# Patient Record
Sex: Male | Born: 1985 | Hispanic: Yes | Marital: Married | Smoking: Current every day smoker
Health system: Southern US, Community
[De-identification: ages and names within clinical notes are randomized; demographics above are authoritative.]

## PROBLEM LIST (undated history)

## (undated) DIAGNOSIS — N471 Phimosis: Secondary | ICD-10-CM

## (undated) DIAGNOSIS — Z973 Presence of spectacles and contact lenses: Secondary | ICD-10-CM

---

## 2003-01-06 ENCOUNTER — Emergency Department (HOSPITAL_COMMUNITY): Admission: EM | Admit: 2003-01-06 | Discharge: 2003-01-06 | Payer: Self-pay | Admitting: Emergency Medicine

## 2003-03-07 ENCOUNTER — Emergency Department (HOSPITAL_COMMUNITY): Admission: EM | Admit: 2003-03-07 | Discharge: 2003-03-07 | Payer: Self-pay | Admitting: Emergency Medicine

## 2003-03-07 ENCOUNTER — Encounter: Payer: Self-pay | Admitting: Emergency Medicine

## 2006-06-17 ENCOUNTER — Emergency Department (HOSPITAL_COMMUNITY): Admission: EM | Admit: 2006-06-17 | Discharge: 2006-06-17 | Payer: Self-pay | Admitting: Emergency Medicine

## 2006-12-02 ENCOUNTER — Emergency Department (HOSPITAL_COMMUNITY): Admission: EM | Admit: 2006-12-02 | Discharge: 2006-12-02 | Payer: Self-pay | Admitting: Emergency Medicine
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
## 2011-09-29 HISTORY — PX: WISDOM TOOTH EXTRACTION: SHX21

## 2015-03-16 ENCOUNTER — Emergency Department (INDEPENDENT_AMBULATORY_CARE_PROVIDER_SITE_OTHER): Payer: 59

## 2015-03-16 ENCOUNTER — Encounter (HOSPITAL_COMMUNITY): Payer: Self-pay | Admitting: Emergency Medicine

## 2015-03-16 ENCOUNTER — Emergency Department (HOSPITAL_COMMUNITY): Payer: 59

## 2015-03-16 ENCOUNTER — Emergency Department (HOSPITAL_COMMUNITY)
Admission: EM | Admit: 2015-03-16 | Discharge: 2015-03-16 | Disposition: A | Discharge status: Home / Self Care | Payer: 59 | Source: Home / Self Care | Attending: Emergency Medicine | Admitting: Emergency Medicine

## 2015-03-16 DIAGNOSIS — S93601A Unspecified sprain of right foot, initial encounter: Secondary | ICD-10-CM | POA: Diagnosis not present

## 2015-03-16 MED ORDER — IBUPROFEN 600 MG PO TABS: 600.0000 mg | ORAL_TABLET | Freq: Four times a day (QID) | ORAL | Status: DC

## 2015-03-16 NOTE — Discharge Instructions (Signed)
You have bruised and sprained your foot. No weightbearing for 3 days. Keep your foot elevated. Apply ice as often as you can. Take ibuprofen 600 mg 3-4 times a day for the next 3 days, then as needed for pain or swelling. No soccer until you can run on the foot without pain. Follow-up as needed.

## 2015-03-16 NOTE — ED Provider Notes (Signed)
CSN: [id number]     Arrival date & time 03/16/15  1418 History   First MD Initiated Contact with Patient 03/16/15 1534     Chief Complaint  Patient presents with  . Foot Injury   (Consider location/radiation/quality/duration/timing/severity/associated sxs/prior Treatment) HPI  He is a 29 year old man here for evaluation of right foot pain. He states he was playing soccer yesterday when he went to kick the ball and instead kicked the bottom of another players shoe. He had immediate pain and swelling. He is able to bear weight, but with significant pain. He has applied icy hot as well as ice packs with minimal improvement. He is able to move his toes and ankle, although this does cause some discomfort.  He works in the [employer].  History reviewed. No pertinent past medical history. History reviewed. No pertinent past surgical history. No family history on file. History  Substance Use Topics  . Smoking status: Never Smoker   . Smokeless tobacco: Not on file  . Alcohol Use: Yes    Review of Systems As in history of present illness Allergies  Review of patient's allergies indicates no known allergies.  Home Medications   Prior to Admission medications   Medication Sig Start Date End Date Taking? Authorizing Provider  ibuprofen (ADVIL,MOTRIN) 600 MG tablet Take 1 tablet (600 mg total) by mouth 4 (four) times daily. For 3 days, then as needed 03/16/15   Charm Rings, MD   BP 115/67 mmHg  Pulse 58  Temp(Src) 98.1 F (36.7 C) (Oral)  Resp 12  SpO2 98% Physical Exam  Constitutional: He is oriented to person, place, and time. He appears well-developed and well-nourished. No distress.  Cardiovascular: Normal rate.   Pulmonary/Chest: Effort normal.  Musculoskeletal:  Right foot: He has significant swelling on the dorsal foot. He has full active and passive range of motion in his toes and ankle. No joint instability. He has a 2+ DP pulse. 5 out of 5 strength. Sensation  grossly intact.  Neurological: He is alert and oriented to person, place, and time.    ED Course  Procedures (including critical care time) Labs Review Labs Reviewed - No data to display  Imaging Review Dg Foot Complete Right  03/16/2015   CLINICAL DATA:  Soccer injury with right foot pain over the dorsum at the region of the metatarsals  EXAM: RIGHT FOOT COMPLETE - 3+ VIEW  COMPARISON:  None.  FINDINGS: There is no evidence of fracture or dislocation. There is no evidence of arthropathy or other focal bone abnormality. Soft tissues are unremarkable. Mild hallux valgus deformity noted.  IMPRESSION: Negative.   Electronically Signed   By: Christiana Pellant M.D.   On: 03/16/2015 15:47     MDM   1. Right foot sprain, initial encounter    X-ray negative for fracture. Conservative management with rest, elevation, ice, ibuprofen. No weightbearing for the next 3 days. Crutches given. Work note provided. Follow-up as needed.    Charm Rings, MD 03/16/15 [phone]

## 2015-03-16 NOTE — ED Notes (Signed)
Reports inj to right foot onset yest while playing soccer States he was trying to kick soccer ball but instead kicked bottom of opponents foot sx include swelling, redness and unable to bear wt Alert, no signs of acute distress.

## 2015-11-27 ENCOUNTER — Other Ambulatory Visit: Payer: Self-pay | Admitting: Urology

## 2015-11-27 DIAGNOSIS — R1030 Lower abdominal pain, unspecified: Secondary | ICD-10-CM | POA: Diagnosis not present

## 2015-11-27 DIAGNOSIS — N471 Phimosis: Secondary | ICD-10-CM | POA: Diagnosis not present

## 2015-12-06 ENCOUNTER — Encounter (HOSPITAL_BASED_OUTPATIENT_CLINIC_OR_DEPARTMENT_OTHER): Payer: Self-pay | Admitting: *Deleted

## 2015-12-06 NOTE — Progress Notes (Addendum)
NPO AFTER MN , PT VERBALIZED UNDERSTANDING THIS INCLUDED NO CHEW/DIP TOBACCO.  ARRIVE AT 0945.  NEEDS HG.

## 2015-12-09 NOTE — H&P (Signed)
  Chief Complaint My skin is tight.   Active Problems Problems  1. Groin pain, right (R10.30) 2. Phimosis (N47.1)  History of Present Illness Derrick Hobbs is a 30 yo male who presents with a long history of penile skin issues. He was seen last year in [place] but a circumcision wasn't done because of insurance issues. He is able to retract the skin but it is tight and he has some cracking of the skin. He has no voiding difficulty. He has normal erections.   Past Medical History Problems  1. History of backache (Z87.39)  Surgical History Problems  1. History of No Surgical Problems  Current Meds 1. Cyclobenzaprine HCl - 10 MG Oral Tablet;  Therapy: (Recorded:01Mar2017) to Recorded  Allergies Medication  1. No Known Drug Allergies  Family History Problems  1. Family history of stroke (Z82.3) : Father  Social History Problems    Alcohol use (Z78.9)   Caffeine use (F15.90)   Current every day smoker (F17.200)   Married   Number of children   Occupation  Review of Systems Genitourinary, constitutional, skin, eye, otolaryngeal, hematologic/lymphatic, cardiovascular, pulmonary, endocrine, musculoskeletal, gastrointestinal, neurological and psychiatric system(s) were reviewed and pertinent findings if present are noted and are otherwise negative.  Musculoskeletal: back pain.    Vitals Vital Signs [Data Includes: Last 1 Day]  Recorded: 01Mar2017 08:54AM  Blood Pressure: 107 / 71 Temperature: 97.6 F Heart Rate: 51 Recorded: 01Mar2017 08:47AM  Height: 5 ft 9 in Weight: 187 lb  BMI Calculated: 27.62 BSA Calculated: 2.01  Physical Exam Constitutional: Well nourished and well developed . No acute distress.  ENT:. The ears and nose are normal in appearance.  Neck: The appearance of the neck is normal and no neck mass is present.  Pulmonary: No respiratory distress and normal respiratory rhythm and effort.  Cardiovascular: Heart rate and rhythm are normal . No peripheral  edema.  Abdomen: The abdomen is soft and nontender. No masses are palpated. No CVA tenderness. No hernias are palpable. but he has tenderness in the right inguinal ring and could have a sports hernia. No hepatosplenomegaly noted.  Genitourinary: Examination of the penis demonstrates phimosis, but no discharge, no masses, no lesions and a normal meatus. The penis is uncircumcised. The scrotum is without lesions. The right epididymis is palpably normal and non-tender. The left epididymis is palpably normal and non-tender. The right testis is non-tender and without masses. The left testis is non-tender and without masses.  Lymphatics: The femoral and inguinal nodes are not enlarged or tender.  Skin: Normal skin turgor, no visible rash and no visible skin lesions.  Neuro/Psych:. Mood and affect are appropriate. Decreased sensation of the perineum/perianal region (S3,4,5).    Assessment Assessed  1. Phimosis (N47.1) 2. Groin pain, right (R10.30)  He has moderate phimosis that is causing problems with erections.   He has right groin pain and a possible sports hernia.   Plan Groin pain, right  1. General Surgery Referral Referral  Referral  Status: Hold For - Appointment,Records   Requested for: 01Mar2017 Phimosis  2. Follow-up Schedule Surgery Office  Follow-up  Status: Hold For - Appointment   Requested for: 01Mar2017  I am going to get him set up for a circumcision and reviewed the risks of bleeding, infection, penile injury, scarring, penile pain, scrotal skin tethering, thrombotic events and anesthetic risks.   I will refer him to Dr. Abbey Chattersosenbower for the groin pain.   Discussion/Summary CC: Dr. Dennis BastYuri Cabeza.

## 2015-12-10 ENCOUNTER — Ambulatory Visit (HOSPITAL_BASED_OUTPATIENT_CLINIC_OR_DEPARTMENT_OTHER): Payer: 59 | Admitting: Anesthesiology

## 2015-12-10 ENCOUNTER — Ambulatory Visit (HOSPITAL_BASED_OUTPATIENT_CLINIC_OR_DEPARTMENT_OTHER)
Admission: RE | Admit: 2015-12-10 | Discharge: 2015-12-10 | Disposition: A | Discharge status: Home / Self Care | Payer: 59 | Source: Ambulatory Visit | Attending: Urology | Admitting: Urology

## 2015-12-10 ENCOUNTER — Encounter (HOSPITAL_BASED_OUTPATIENT_CLINIC_OR_DEPARTMENT_OTHER): Payer: Self-pay | Admitting: *Deleted

## 2015-12-10 ENCOUNTER — Encounter (HOSPITAL_BASED_OUTPATIENT_CLINIC_OR_DEPARTMENT_OTHER)
Admission: RE | Disposition: A | Discharge status: Home / Self Care | Payer: Self-pay | Source: Ambulatory Visit | Attending: Urology

## 2015-12-10 DIAGNOSIS — F17200 Nicotine dependence, unspecified, uncomplicated: Secondary | ICD-10-CM | POA: Insufficient documentation

## 2015-12-10 DIAGNOSIS — R1031 Right lower quadrant pain: Secondary | ICD-10-CM | POA: Insufficient documentation

## 2015-12-10 DIAGNOSIS — N471 Phimosis: Secondary | ICD-10-CM | POA: Insufficient documentation

## 2015-12-10 HISTORY — PX: CIRCUMCISION: SHX1350

## 2015-12-10 HISTORY — DX: Phimosis: N47.1

## 2015-12-10 HISTORY — DX: Presence of spectacles and contact lenses: Z97.3

## 2015-12-10 LAB — POCT HEMOGLOBIN-HEMACUE: Hemoglobin: 12.5 g/dL — ABNORMAL LOW (ref 13.0–17.0)

## 2015-12-10 SURGERY — CIRCUMCISION, ADULT
Anesthesia: General

## 2015-12-10 MED ORDER — ONDANSETRON HCL 4 MG/2ML IJ SOLN
INTRAMUSCULAR | Status: DC | PRN
  Administered 2015-12-10: 4 mg via INTRAVENOUS

## 2015-12-10 MED ORDER — PROPOFOL 10 MG/ML IV BOLUS
INTRAVENOUS | Status: DC | PRN
  Administered 2015-12-10: 200 mg via INTRAVENOUS
  Administered 2015-12-10 (×2): 20 mg via INTRAVENOUS

## 2015-12-10 MED ORDER — MIDAZOLAM HCL 2 MG/2ML IJ SOLN
INTRAMUSCULAR | Status: AC
  Filled 2015-12-10: qty 2

## 2015-12-10 MED ORDER — MIDAZOLAM HCL 2 MG/2ML IJ SOLN
0.5000 mg | Freq: Once | INTRAMUSCULAR | Status: DC | PRN
  Filled 2015-12-10: qty 2

## 2015-12-10 MED ORDER — LIDOCAINE HCL (PF) 1 % IJ SOLN
INTRAMUSCULAR | Status: DC | PRN
  Administered 2015-12-10: 3 mL

## 2015-12-10 MED ORDER — HYDROCODONE-ACETAMINOPHEN 5-325 MG PO TABS: 1.0000 | ORAL_TABLET | Freq: Four times a day (QID) | ORAL | Status: DC | PRN

## 2015-12-10 MED ORDER — PROMETHAZINE HCL 25 MG/ML IJ SOLN
6.2500 mg | INTRAMUSCULAR | Status: DC | PRN
  Filled 2015-12-10: qty 1

## 2015-12-10 MED ORDER — FENTANYL CITRATE (PF) 100 MCG/2ML IJ SOLN
INTRAMUSCULAR | Status: DC | PRN
  Administered 2015-12-10: 50 ug via INTRAVENOUS

## 2015-12-10 MED ORDER — EPHEDRINE SULFATE 50 MG/ML IJ SOLN
INTRAMUSCULAR | Status: AC
  Filled 2015-12-10: qty 1

## 2015-12-10 MED ORDER — EPHEDRINE SULFATE 50 MG/ML IJ SOLN
INTRAMUSCULAR | Status: AC
Start: 2015-12-10 — End: 2015-12-10
  Filled 2015-12-10: qty 1

## 2015-12-10 MED ORDER — ONDANSETRON HCL 4 MG/2ML IJ SOLN
INTRAMUSCULAR | Status: AC
  Filled 2015-12-10: qty 2

## 2015-12-10 MED ORDER — FENTANYL CITRATE (PF) 100 MCG/2ML IJ SOLN
25.0000 ug | INTRAMUSCULAR | Status: DC | PRN
  Filled 2015-12-10: qty 1

## 2015-12-10 MED ORDER — LIDOCAINE HCL (CARDIAC) 20 MG/ML IV SOLN
INTRAVENOUS | Status: AC
  Filled 2015-12-10: qty 5

## 2015-12-10 MED ORDER — BUPIVACAINE HCL (PF) 0.25 % IJ SOLN
INTRAMUSCULAR | Status: DC | PRN
  Administered 2015-12-10: 3 mL

## 2015-12-10 MED ORDER — LACTATED RINGERS IV SOLN
INTRAVENOUS | Status: DC
  Administered 2015-12-10 (×2): via INTRAVENOUS
  Filled 2015-12-10: qty 1000

## 2015-12-10 MED ORDER — MEPERIDINE HCL 25 MG/ML IJ SOLN
6.2500 mg | INTRAMUSCULAR | Status: DC | PRN
  Filled 2015-12-10: qty 1

## 2015-12-10 MED ORDER — DEXAMETHASONE SODIUM PHOSPHATE 4 MG/ML IJ SOLN
INTRAMUSCULAR | Status: DC | PRN
  Administered 2015-12-10: 10 mg via INTRAVENOUS

## 2015-12-10 MED ORDER — FENTANYL CITRATE (PF) 100 MCG/2ML IJ SOLN
INTRAMUSCULAR | Status: AC
  Filled 2015-12-10: qty 4

## 2015-12-10 MED ORDER — PROPOFOL 10 MG/ML IV BOLUS
INTRAVENOUS | Status: AC
  Filled 2015-12-10: qty 20

## 2015-12-10 MED ORDER — MIDAZOLAM HCL 5 MG/5ML IJ SOLN
INTRAMUSCULAR | Status: DC | PRN
  Administered 2015-12-10 (×2): 2 mg via INTRAVENOUS

## 2015-12-10 MED ORDER — LIDOCAINE HCL (CARDIAC) 20 MG/ML IV SOLN
INTRAVENOUS | Status: DC | PRN
  Administered 2015-12-10: 30 mg via INTRAVENOUS

## 2015-12-10 MED ORDER — FENTANYL CITRATE (PF) 100 MCG/2ML IJ SOLN
INTRAMUSCULAR | Status: AC
  Filled 2015-12-10: qty 2

## 2015-12-10 SURGICAL SUPPLY — 40 items
BANDAGE CO FLEX L/F 2IN X 5YD (GAUZE/BANDAGES/DRESSINGS) ×2 IMPLANT
BANDAGE CONFORM 2X5YD N/S (GAUZE/BANDAGES/DRESSINGS) ×1 IMPLANT
BLADE SURG 15 STRL LF DISP TIS (BLADE) ×1 IMPLANT
BLADE SURG 15 STRL SS (BLADE) ×2
BNDG CONFORM 2 STRL LF (GAUZE/BANDAGES/DRESSINGS) ×1 IMPLANT
CLEANER CAUTERY TIP 5X5 PAD (MISCELLANEOUS) ×1 IMPLANT
COVER BACK TABLE 60X90IN (DRAPES) ×2 IMPLANT
COVER MAYO STAND STRL (DRAPES) ×2 IMPLANT
DRAPE LAPAROTOMY 100X72 PEDS (DRAPES) ×2 IMPLANT
ELECT NDL TIP 2.8 STRL (NEEDLE) IMPLANT
ELECT NEEDLE TIP 2.8 STRL (NEEDLE) IMPLANT
ELECT REM PT RETURN 9FT ADLT (ELECTROSURGICAL) ×2
ELECTRODE REM PT RTRN 9FT ADLT (ELECTROSURGICAL) ×1 IMPLANT
GAUZE XEROFORM 1X8 LF (GAUZE/BANDAGES/DRESSINGS) ×2 IMPLANT
GLOVE BIO SURGEON STRL SZ7 (GLOVE) ×1 IMPLANT
GLOVE BIOGEL PI IND STRL 7.0 (GLOVE) IMPLANT
GLOVE BIOGEL PI IND STRL 7.5 (GLOVE) IMPLANT
GLOVE BIOGEL PI INDICATOR 7.0 (GLOVE) ×1
GLOVE BIOGEL PI INDICATOR 7.5 (GLOVE) ×1
GLOVE SURG SS PI 7.0 STRL IVOR (GLOVE) ×1 IMPLANT
GLOVE SURG SS PI 8.0 STRL IVOR (GLOVE) ×2 IMPLANT
GOWN STRL REUS W/ TWL LRG LVL3 (GOWN DISPOSABLE) ×1 IMPLANT
GOWN STRL REUS W/ TWL XL LVL3 (GOWN DISPOSABLE) ×1 IMPLANT
GOWN STRL REUS W/TWL LRG LVL3 (GOWN DISPOSABLE) ×2
GOWN STRL REUS W/TWL XL LVL3 (GOWN DISPOSABLE) ×2
KIT ROOM TURNOVER WOR (KITS) ×2 IMPLANT
NDL HYPO 25X1 1.5 SAFETY (NEEDLE) ×1 IMPLANT
NEEDLE HYPO 25X1 1.5 SAFETY (NEEDLE) ×2 IMPLANT
NS IRRIG 500ML POUR BTL (IV SOLUTION) ×1 IMPLANT
PACK BASIN DAY SURGERY FS (CUSTOM PROCEDURE TRAY) ×2 IMPLANT
PAD CLEANER CAUTERY TIP 5X5 (MISCELLANEOUS) ×1
PENCIL BUTTON HOLSTER BLD 10FT (ELECTRODE) ×2 IMPLANT
SUT CHROMIC 4 0 PS 2 18 (SUTURE) ×4 IMPLANT
SYR CONTROL 10ML LL (SYRINGE) ×1 IMPLANT
SYRINGE CONTROL L 12CC (SYRINGE) IMPLANT
SYRINGE CONTROL LL 12CC (SYRINGE) ×1 IMPLANT
TOWEL OR 17X24 6PK STRL BLUE (TOWEL DISPOSABLE) ×4 IMPLANT
TRAY DSU PREP LF (CUSTOM PROCEDURE TRAY) ×2 IMPLANT
TUBE CONNECTING 12X1/4 (SUCTIONS) IMPLANT
WATER STERILE IRR 500ML POUR (IV SOLUTION) IMPLANT

## 2015-12-10 NOTE — Interval H&P Note (Signed)
History and Physical Interval Note:  12/10/2015 9:04 AM  Derrick Hobbs  has presented today for surgery, with the diagnosis of phimosis  The various methods of treatment have been discussed with the patient and family. After consideration of risks, benefits and other options for treatment, the patient has consented to  Procedure(s): CIRCUMCISION ADULT (N/A) as a surgical intervention .  The patient's history has been reviewed, patient examined, no change in status, stable for surgery.  I have reviewed the patient's chart and labs.  Questions were answered to the patient's satisfaction.     Easter Schinke J

## 2015-12-10 NOTE — Transfer of Care (Signed)
Immediate Anesthesia Transfer of Care Note  Patient: Derrick LeitzHector M Hobbs  Procedure(s) Performed: Procedure(s): CIRCUMCISION ADULT (N/A)  Patient Location: PACU  Anesthesia Type:General  Level of Consciousness: awake, alert , oriented and patient cooperative  Airway & Oxygen Therapy: Patient Spontanous Breathing and Patient connected to nasal cannula oxygen  Post-op Assessment: Report given to RN and Post -op Vital signs reviewed and stable  Post vital signs: Reviewed and stable SaO2 100% 61-16 BP 120/70  Last Vitals:  Filed Vitals:   12/10/15 0934  BP: 123/73  Pulse: 82  Temp: 36.4 C  Resp: 16    Complications: No apparent anesthesia complications

## 2015-12-10 NOTE — Brief Op Note (Signed)
12/10/2015  11:56 AM  PATIENT:  Derrick LeitzHector M Hobbs  30 y.o. male  PRE-OPERATIVE DIAGNOSIS:  phimosis  POST-OPERATIVE DIAGNOSIS:  phimosis  PROCEDURE:  Procedure(s): CIRCUMCISION ADULT (N/A)  SURGEON:  Surgeon(s) and Role:    * Bjorn PippinJohn Sinai Illingworth, MD - Primary  PHYSICIAN ASSISTANT:   ASSISTANTS: none   ANESTHESIA:   local and general  EBL:     BLOOD ADMINISTERED:none  DRAINS: none   LOCAL MEDICATIONS USED:  MARCAINE   , LIDOCAINE  and Amount: 6 ml  SPECIMEN:  No Specimen  DISPOSITION OF SPECIMEN:  N/A  COUNTS:  YES  TOURNIQUET:  * No tourniquets in log *  DICTATION: .Other Dictation: Dictation Number [id number]  PLAN OF CARE: Discharge to home after PACU  PATIENT DISPOSITION:  PACU - hemodynamically stable.   Delay start of Pharmacological VTE agent (>24hrs) due to surgical blood loss or risk of bleeding: not applicable

## 2015-12-10 NOTE — Anesthesia Preprocedure Evaluation (Addendum)
Anesthesia Evaluation  Patient identified by MRN, date of birth, ID band Patient awake    Reviewed: Allergy & Precautions, NPO status , Patient's Chart, lab work & pertinent test results  History of Anesthesia Complications Negative for: history of anesthetic complications  Airway Mallampati: II  TM Distance: >3 FB Neck ROM: Full    Dental  (+) Dental Advisory Given, Teeth Intact   Pulmonary Current Smoker,    breath sounds clear to auscultation       Cardiovascular Exercise Tolerance: Good negative cardio ROS   Rhythm:Regular Rate:Normal     Neuro/Psych negative neurological ROS     GI/Hepatic negative GI ROS, (+)     substance abuse  marijuana use,   Endo/Other  negative endocrine ROS  Renal/GU negative Renal ROS     Musculoskeletal   Abdominal   Peds  Hematology negative hematology ROS (+)   Anesthesia Other Findings   Reproductive/Obstetrics                         Anesthesia Physical Anesthesia Plan  ASA: II  Anesthesia Plan: General   Post-op Pain Management:    Induction: Intravenous  Airway Management Planned: LMA  Additional Equipment:   Intra-op Plan:   Post-operative Plan:   Informed Consent: I have reviewed the patients History and Physical, chart, labs and discussed the procedure including the risks, benefits and alternatives for the proposed anesthesia with the patient or authorized representative who has indicated his/her understanding and acceptance.   Dental advisory given  Plan Discussed with: CRNA and Surgeon  Anesthesia Plan Comments: (Plan routine monitors, GA- LMA OK)       Anesthesia Quick Evaluation

## 2015-12-10 NOTE — Anesthesia Postprocedure Evaluation (Signed)
Anesthesia Post Note  Patient: Derrick LeitzHector M Hobbs  Procedure(s) Performed: Procedure(s) (LRB): CIRCUMCISION ADULT (N/A)  Patient location during evaluation: PACU Anesthesia Type: General Level of consciousness: awake and alert, oriented and patient cooperative Pain management: pain level controlled Vital Signs Assessment: post-procedure vital signs reviewed and stable Respiratory status: spontaneous breathing, nonlabored ventilation and respiratory function stable Cardiovascular status: blood pressure returned to baseline and stable Postop Assessment: no signs of nausea or vomiting Anesthetic complications: no    Last Vitals:  Filed Vitals:   12/10/15 1230 12/10/15 1245  BP: 105/59   Pulse: 49 52  Temp:    Resp: 14 14    Last Pain: There were no vitals filed for this visit.               Germaine PomfretJACKSON,E. Damier Disano

## 2015-12-10 NOTE — Transfer of Care (Signed)
Immediate Anesthesia Transfer of Care Note  Patient: Derrick Hobbs  Procedure(s) Performed: Procedure(s): CIRCUMCISION ADULT (N/A)  Patient Location: PACU  Anesthesia Type:General  Level of Consciousness: awake, alert , oriented and patient cooperative  Airway & Oxygen Therapy: Patient Spontanous Breathing and Patient connected to nasal cannula oxygen  Post-op Assessment: Report given to RN and Post -op Vital signs reviewed and stable  Post vital signs: Reviewed and stable  Last Vitals:  Filed Vitals:   12/10/15 0934  BP: 123/73  Pulse: 82  Temp: 36.4 C  Resp: 16    Complications: No apparent anesthesia complications

## 2015-12-10 NOTE — Discharge Instructions (Addendum)
Circumcision-Home Care Instructions ° °The following instructions have been prepared to help you care for yourself upon your return home today.  ° °Wound Care & Hygiene:  ° °You may apply ice to the penis.  This may help to decrease swelling.  Remove the dressing tomorrow.  If the dressing falls off before then, leave it off.  You may shower or bathe in 48 hours  Gently wash the penis with soap and water.  The stitches do not need to be removed. ° °Activity: ° °Do not drive or operate any equipment today.  The effects of anesthesia are still present, drowsiness may result.  Rest today, not necessarily flat bed rest, just take it easy.  You may resume your normal activity in one to two days or as indicated by your physician. ° °Sexual Activity:  Erection and sexual relations should be avoided for *2 weeks. ° °Return to Work: ° °One to two days or as indicated by your physician. ° °Diet:  Drink liquids or eat a very light diet this evening.  You may resume a regular diet tomorrow. ° °General Expectations of your surgery: ° °· You may have a small amount of bleeding °· The penis will be swollen and bruised for approximately one week °· You may wake during the night with an erection, usually this is caused by having a full bladder so you should try to urinate (pass your water) to relieve the erection or apply ice to the penis ° °Unexpected Observations - Call your doctor if these occur! °· Persistent or heavy bleeding °· Temperature of 101 degrees or more °· Severe pain not relieved by medication ° ° °Patient's Signature:__________________________________________________ ° °Physician's Signature:___________________ Nurse's Signature_______________ ° °[facility] [address], [place], [place] [address] °Tel: [phone] ° ° ° °Post Anesthesia Home Care Instructions ° °Activity: °Get plenty of rest for the remainder of the day. A responsible adult should stay with you for 24 hours following the  procedure.  °For the next 24 hours, DO NOT: °-Drive a car °-Operate machinery °-Drink alcoholic beverages °-Take any medication unless instructed by your physician °-Make any legal decisions or sign important papers. ° °Meals: °Start with liquid foods such as gelatin or soup. Progress to regular foods as tolerated. Avoid greasy, spicy, heavy foods. If nausea and/or vomiting occur, drink only clear liquids until the nausea and/or vomiting subsides. Call your physician if vomiting continues. ° °Special Instructions/Symptoms: °Your throat may feel dry or sore from the anesthesia or the breathing tube placed in your throat during surgery. If this causes discomfort, gargle with warm salt water. The discomfort should disappear within 24 hours. ° °If you had a scopolamine patch placed behind your ear for the management of post- operative nausea and/or vomiting: ° °1. The medication in the patch is effective for 72 hours, after which it should be removed.  Wrap patch in a tissue and discard in the trash. Wash hands thoroughly with soap and water. °2. You may remove the patch earlier than 72 hours if you experience unpleasant side effects which may include dry mouth, dizziness or visual disturbances. °3. Avoid touching the patch. Wash your hands with soap and water after contact with the patch. °  ° °

## 2015-12-10 NOTE — Anesthesia Procedure Notes (Signed)
Procedure Name: LMA Insertion Date/Time: 12/10/2015 11:17 AM Performed by: Tyrone NineSAUVE, Anmol Fleck F Pre-anesthesia Checklist: Patient identified, Timeout performed, Emergency Drugs available, Suction available and Patient being monitored Oxygen Delivery Method: Circle system utilized Preoxygenation: Pre-oxygenation with 100% oxygen Intubation Type: IV induction Ventilation: Mask ventilation without difficulty LMA: LMA inserted LMA Size: 4.0 Number of attempts: 1 Airway Equipment and Method: Bite block Placement Confirmation: breath sounds checked- equal and bilateral and positive ETCO2 Tube secured with: Tape Dental Injury: Teeth and Oropharynx as per pre-operative assessment

## 2015-12-11 ENCOUNTER — Encounter (HOSPITAL_BASED_OUTPATIENT_CLINIC_OR_DEPARTMENT_OTHER): Payer: Self-pay | Admitting: Urology

## 2015-12-11 NOTE — Op Note (Signed)
NAMNaomie Dean:  Hobbs, Derrick              ACCOUNT NO.:  [id number]  MEDICAL RECORD NO.:  [id number]  LOCATION:                                 FACILITY:  PHYSICIAN:  Excell SeltzerJohn J. Annabell HowellsWrenn, M.D.    DATE OF BIRTH:  Feb 20, 1986  DATE OF PROCEDURE:  12/10/2015 DATE OF DISCHARGE:                              OPERATIVE REPORT   PROCEDURE:  Circumcision.  PREOPERATIVE DIAGNOSIS:  Phimosis.  POSTOPERATIVE DIAGNOSIS:  Phimosis.  SURGEON:  Excell SeltzerJohn J. Annabell HowellsWrenn, M.D.  ANESTHESIA:  General.  SPECIMEN:  None.  DRAINS:  None.  BLOOD LOSS:  Minimal.  COMPLICATIONS:  None.  INDICATIONS:  Mr. [name] is a 30 year old male with phimotic foreskin, has elected circumcision.  FINDINGS OF PROCEDURE:  He was taken to the operating room where general anesthetic was induced.  He was placed in the supine position.  His perineum and genitalia were prepped with Betadine solution.  He was draped in the usual sterile fashion.  A penile block was performed with 6 mL of mixture of 50% of 1% lidocaine and 50% of 0.25% Marcaine.  A sleeve resection of the redundant foreskin was performed after marking to ensure that no scrotal tethering occurred from inadequate skin.  Once the skin had been resected, hemostasis was achieved with the Bovie. The skin edges were then reapproximated using a running 4-0 chromic tied in the quadrants.  Once the circumcision was complete, inspection revealed excellent tailoring of the skin with no tethering.  The penis was cleansed, dried, and then dressed with a Xeroform, Kling, and Coban.  The patient's anesthetic was reversed.  He was moved to recovery room in stable condition.     Excell SeltzerJohn J. Annabell HowellsWrenn, M.D.     JJW/MEDQ  D:  12/10/2015  T:  12/11/2015  Job:  [id number]

## 2017-05-03 DIAGNOSIS — H5213 Myopia, bilateral: Secondary | ICD-10-CM | POA: Diagnosis not present

## 2017-05-03 DIAGNOSIS — H52223 Regular astigmatism, bilateral: Secondary | ICD-10-CM | POA: Diagnosis not present

## 2017-05-12 ENCOUNTER — Ambulatory Visit (INDEPENDENT_AMBULATORY_CARE_PROVIDER_SITE_OTHER): Payer: 59 | Admitting: Podiatry

## 2017-05-12 ENCOUNTER — Encounter: Payer: Self-pay | Admitting: Podiatry

## 2017-05-12 DIAGNOSIS — B353 Tinea pedis: Secondary | ICD-10-CM | POA: Diagnosis not present

## 2017-05-12 MED ORDER — TERBINAFINE HCL 250 MG PO TABS: 250.0000 mg | ORAL_TABLET | Freq: Every day | ORAL | 0 refills | Status: DC

## 2017-05-12 MED ORDER — CLOTRIMAZOLE-BETAMETHASONE 1-0.05 % EX CREA: 1.0000 "application " | TOPICAL_CREAM | Freq: Two times a day (BID) | CUTANEOUS | 2 refills | Status: DC

## 2017-05-12 NOTE — Progress Notes (Signed)
Patient ID: Derrick LeitzHector M Salazar, male   DOB: 1985-12-22, 31 y.o.   MRN: [id number]   HPI: 31 year old male presents to the office today for evaluation of itching to the bilateral feet. Patient states that he has a history of itching with peeling skin and occasional blisters which occur off and on. This condition has been ongoing for several years now. Patient has tried over-the-counter creams antifungal which did not help. Patient presents today for further treatment and evaluation   Physical Exam: General: The patient is alert and oriented x3 in no acute distress.  Dermatology: Skin is warm, dry and supple bilateral lower extremities. Negative for open lesions or macerations. Diffuse hyperkeratotic flaking skin noted to the bilateral weightbearing surfaces of the feet. There is some superficial fissures that do not extend through the dermis. No open lesions or drainage. Pruritus noted.  Vascular: Palpable pedal pulses bilaterally. No edema or erythema noted. Capillary refill within normal limits.  Neurological: Epicritic and protective threshold grossly intact bilaterally.   Musculoskeletal Exam: Range of motion within normal limits to all pedal and ankle joints bilateral. Muscle strength 5/5 in all groups bilateral.    Assessment: 1. Tinea pedis bilateral   Plan of Care:  1. Patient was evaluated. 2. Informed the patient that he likely has tinea pedis. Discussed different treatment options. Discussed the importance of maintaining good foot hygiene 3. Prescription for Lotrisone cream 4. Prescription for Lamisil 250 mg #28 5. Return to clinic in 4 weeks  Patient's spouse works in the cardiac cath at [employer]    Brent M. Evans, DPM [facility]  Dr. Felecia ShellingBrent M. Evans, DPM    [address]                                        [place], [place] [address]                Office [phone]  Fax [phone]

## 2017-05-12 NOTE — Progress Notes (Signed)
   Subjective:    Patient ID: Carollee LeitzHector M Salazar, male    DOB: 06-Apr-1986, 31 y.o.   MRN: [id number]  HPI  Chief Complaint  Patient presents with  . Skin Problem    B/L - bottom of foot, itches/dry skin.        Review of Systems  Skin: Positive for rash.       Open sores        Objective:   Physical Exam        Assessment & Plan:

## 2017-06-09 ENCOUNTER — Ambulatory Visit: Payer: 59 | Admitting: Podiatry

## 2017-10-08 ENCOUNTER — Ambulatory Visit (INDEPENDENT_AMBULATORY_CARE_PROVIDER_SITE_OTHER): Payer: No Typology Code available for payment source | Admitting: Family Medicine

## 2017-10-08 ENCOUNTER — Encounter: Payer: Self-pay | Admitting: Family Medicine

## 2017-10-08 VITALS — BP 118/78 | HR 64 | Temp 98.1°F | Ht 69.0 in | Wt 218.2 lb

## 2017-10-08 DIAGNOSIS — Z114 Encounter for screening for human immunodeficiency virus [HIV]: Secondary | ICD-10-CM

## 2017-10-08 DIAGNOSIS — Z0000 Encounter for general adult medical examination without abnormal findings: Secondary | ICD-10-CM

## 2017-10-08 DIAGNOSIS — N529 Male erectile dysfunction, unspecified: Secondary | ICD-10-CM | POA: Diagnosis not present

## 2017-10-08 NOTE — Progress Notes (Signed)
Pre visit review using our clinic review tool, if applicable. No additional management support is needed unless otherwise documented below in the visit note. 

## 2017-10-08 NOTE — Progress Notes (Signed)
Chief Complaint  Patient presents with  . Establish Care    Well Male Derrick Hobbs is here for a complete physical.   His last physical was >1 year ago.  Current diet: in general, a "healthy" diet   Current exercise: plays soccer Weight trend: losing steadily (intentional0 Does pt snore? No.  Daytime fatigue? No. Seat belt? Yes.    6 mo ED. Will sometimes wake up with erections, but decreased in frequency. Has difficulty both maintaining and attaining and erection. He has gained weight over the past several years. He is starting to eat cleaning and exercise again.  Health maintenance Tetanus- Yes 12/27/2016 HIV- No  Past Medical History:  Diagnosis Date  . Phimosis   . Wears contact lenses    left eye only    Past Surgical History:  Procedure Laterality Date  . CIRCUMCISION N/A 12/10/2015   Procedure: CIRCUMCISION ADULT;  Surgeon: Bjorn PippinJohn Wrenn, MD;  [facility];  Service: Urology;  Laterality: N/A;  . WISDOM TOOTH EXTRACTION  2013   Medications  Takes no meds routinely.    Allergies No Known Allergies   Family History Family History  Problem Relation Age of Onset  . Hypertension Mother   . Asthma Daughter     Review of Systems: Constitutional: no fevers or chills Eye:  no recent significant change in vision Ear/Nose/Mouth/Throat:  Ears:  no tinnitus or hearing loss Nose/Mouth/Throat:  no complaints of nasal congestion or bleeding, no sore throat Cardiovascular:  no chest pain, no palpitations Respiratory:  no cough and no shortness of breath Gastrointestinal:  no abdominal pain, no change in bowel habits, no nausea, vomiting, diarrhea, or constipation and no black or bloody stool GU:  Male: +ED, negative for dysuria, frequency, and incontinence and negative for prostate symptoms Musculoskeletal/Extremities:  no pain, redness, or swelling of the joints Integumentary (Skin/Breast):  no abnormal skin lesions reported Neurologic:  no  headaches, no numbness, tingling Endocrine: No unexpected weight changes Hematologic/Lymphatic:  no night sweats  Exam BP 118/78 (BP Location: Left Arm, Patient Position: Sitting, Cuff Size: Normal)   Pulse 64   Temp 98.1 F (36.7 C) (Rectal)   Ht 5\' 9"  (1.753 m)   Wt 218 lb 4 oz (99 kg)   SpO2 97%   BMI 32.23 kg/m  General:  well developed, well nourished, in no apparent distress Skin:  no significant moles, warts, or growths Head:  no masses, lesions, or tenderness Eyes:  pupils equal and round, sclera anicteric without injection Ears:  canals without lesions, TMs shiny without retraction, no obvious effusion, no erythema Nose:  nares patent, septum midline, mucosa normal Throat/Pharynx:  lips and gingiva without lesion; tongue and uvula midline; non-inflamed pharynx; no exudates or postnasal drainage Neck: neck supple without adenopathy, thyromegaly, or masses Lungs:  clear to auscultation, breath sounds equal bilaterally, no respiratory distress Cardio:  regular rate and rhythm without murmurs, heart sounds without clicks or rubs Abdomen:  abdomen soft, nontender; bowel sounds normal; no masses or organomegaly Genital (male): circumcised penis, no lesions or discharge; testes present bilaterally Rectal: Deferred Musculoskeletal:  symmetrical muscle groups noted without atrophy or deformity Extremities:  no clubbing, cyanosis, or edema, no deformities, no skin discoloration Neuro:  gait normal; deep tendon reflexes normal and symmetric Psych: well oriented with normal range of affect and appropriate judgment/insight  Assessment and Plan  Well adult exam - Plan: Lipid panel  Erectile dysfunction, unspecified erectile dysfunction type - Plan: Testosterone Total,Free,Bio, Males-([facility]), TSH, CBC,  Comprehensive metabolic panel  Screening for HIV (human immunodeficiency virus) - Plan: HIV antibody   Well 32 y.o. male. Counseled on diet and exercise. Other orders as above. Ck  Testosterone and TSH for ED, mind the hard core porn, keep an eye on non arousal related erections. If labs are normal, will consider low dose sildenafil. Warned about priapism.  Follow up in 1 year pending the above workup. The patient voiced understanding and agreement to the plan.  Jilda Roche [facility], DO 10/08/17 4:14 PM

## 2017-10-08 NOTE — Patient Instructions (Addendum)
Come to your labs fasting.   Aim to do some physical exertion for 150 minutes per week. This is typically divided into 5 days per week, 30 minutes per day. The activity should be enough to get your heart rate up. Anything is better than nothing if you have time constraints.  Keep eating healthy. This could also be helpful for your erections and naturally boost your testosterone levels.  Let us know if you need anything.

## 2017-10-11 ENCOUNTER — Other Ambulatory Visit (INDEPENDENT_AMBULATORY_CARE_PROVIDER_SITE_OTHER): Payer: No Typology Code available for payment source

## 2017-10-11 DIAGNOSIS — N529 Male erectile dysfunction, unspecified: Secondary | ICD-10-CM | POA: Diagnosis not present

## 2017-10-11 DIAGNOSIS — Z0000 Encounter for general adult medical examination without abnormal findings: Secondary | ICD-10-CM

## 2017-10-11 LAB — LIPID PANEL
Cholesterol: 239 mg/dL — ABNORMAL HIGH (ref 0–200)
HDL: 48.4 mg/dL (ref >39.00)
NONHDL: 190.35
Total CHOL/HDL Ratio: 5
Triglycerides: 219 mg/dL — ABNORMAL HIGH (ref 0.0–149.0)
VLDL: 43.8 mg/dL — AB (ref 0.0–40.0)

## 2017-10-11 LAB — COMPREHENSIVE METABOLIC PANEL
ALBUMIN: 4.3 g/dL (ref 3.5–5.2)
ALT: 18 U/L (ref 0–53)
AST: 14 U/L (ref 0–37)
Alkaline Phosphatase: 65 U/L (ref 39–117)
BUN: 12 mg/dL (ref 6–23)
CALCIUM: 9.4 mg/dL (ref 8.4–10.5)
CHLORIDE: 104 meq/L (ref 96–112)
CO2: 29 mEq/L (ref 19–32)
Creatinine, Ser: 0.99 mg/dL (ref 0.40–1.50)
GFR: 92.43 mL/min (ref >60.00)
Glucose, Bld: 107 mg/dL — ABNORMAL HIGH (ref 70–99)
Potassium: 4.3 mEq/L (ref 3.5–5.1)
Sodium: 140 mEq/L (ref 135–145)
Total Bilirubin: 0.5 mg/dL (ref 0.2–1.2)
Total Protein: 7.2 g/dL (ref 6.0–8.3)

## 2017-10-11 LAB — CBC
HEMATOCRIT: 46.6 % (ref 39.0–52.0)
HEMOGLOBIN: 15.8 g/dL (ref 13.0–17.0)
MCHC: 33.9 g/dL (ref 30.0–36.0)
MCV: 90.2 fl (ref 78.0–100.0)
Platelets: 223 10*3/uL (ref 150.0–400.0)
RBC: 5.16 Mil/uL (ref 4.22–5.81)
RDW: 12.7 % (ref 11.5–15.5)
WBC: 4.8 10*3/uL (ref 4.0–10.5)

## 2017-10-11 LAB — TSH: TSH: 2.62 u[IU]/mL (ref 0.35–4.50)

## 2017-10-11 LAB — LDL CHOLESTEROL, DIRECT: Direct LDL: 173 mg/dL

## 2017-10-12 LAB — TESTOSTERONE TOTAL,FREE,BIO, MALES
Albumin: 4.5 g/dL (ref 3.6–5.1)
SEX HORMONE BINDING: 19 nmol/L (ref 10–50)
TESTOSTERONE: 232 ng/dL — AB (ref 250–827)

## 2017-10-13 ENCOUNTER — Other Ambulatory Visit: Payer: Self-pay | Admitting: Family Medicine

## 2017-10-13 DIAGNOSIS — R7989 Other specified abnormal findings of blood chemistry: Secondary | ICD-10-CM

## 2017-10-13 NOTE — Progress Notes (Signed)
T order placed.

## 2017-10-25 ENCOUNTER — Telehealth: Payer: Self-pay | Admitting: Family Medicine

## 2017-10-25 NOTE — Telephone Encounter (Signed)
Patient had been informed of his results/already--- he wanted a paper copy. Put at the front desk and he will pickup tomorrow morning while here getting lab work done

## 2017-10-25 NOTE — Telephone Encounter (Signed)
Copied from CRM [phone] [id number]. Topic: Quick Communication - See Telephone Encounter >> Oct 25, 2017  4:11 PM Rudi CocoLathan, [place]hia Seat M, [place] wrote: CRM for notification. See Telephone encounter for:   10/25/17. Pt. Calling to see what his lab results are from 2 weeks ago. Pt. Coming for labs on 10-26-2017 pt. Can be reached at [phone]

## 2017-10-26 ENCOUNTER — Other Ambulatory Visit (INDEPENDENT_AMBULATORY_CARE_PROVIDER_SITE_OTHER): Payer: No Typology Code available for payment source

## 2017-10-26 DIAGNOSIS — R7989 Other specified abnormal findings of blood chemistry: Secondary | ICD-10-CM | POA: Diagnosis not present

## 2017-10-27 ENCOUNTER — Other Ambulatory Visit: Payer: No Typology Code available for payment source

## 2017-10-30 LAB — TESTOSTERONE, FREE & TOTAL
FREE TESTOSTERONE: 51.4 pg/mL (ref 35.0–155.0)
Testosterone, Total, LC-MS-MS: 265 ng/dL (ref 250–1100)

## 2019-05-24 ENCOUNTER — Encounter: Payer: Self-pay | Admitting: Family Medicine

## 2019-05-24 ENCOUNTER — Ambulatory Visit (INDEPENDENT_AMBULATORY_CARE_PROVIDER_SITE_OTHER): Payer: No Typology Code available for payment source | Admitting: Family Medicine

## 2019-05-24 ENCOUNTER — Other Ambulatory Visit: Payer: Self-pay

## 2019-05-24 VITALS — BP 108/64 | HR 57 | Temp 98.7°F | Ht 69.0 in | Wt 199.5 lb

## 2019-05-24 DIAGNOSIS — Z0000 Encounter for general adult medical examination without abnormal findings: Secondary | ICD-10-CM | POA: Diagnosis not present

## 2019-05-24 LAB — CBC
HCT: 42.3 % (ref 39.0–52.0)
Hemoglobin: 14.3 g/dL (ref 13.0–17.0)
MCHC: 33.7 g/dL (ref 30.0–36.0)
MCV: 91.8 fl (ref 78.0–100.0)
Platelets: 226 10*3/uL (ref 150.0–400.0)
RBC: 4.61 Mil/uL (ref 4.22–5.81)
RDW: 12.9 % (ref 11.5–15.5)
WBC: 6.4 10*3/uL (ref 4.0–10.5)

## 2019-05-24 LAB — COMPREHENSIVE METABOLIC PANEL
ALT: 12 U/L (ref 0–53)
AST: 13 U/L (ref 0–37)
Albumin: 4.4 g/dL (ref 3.5–5.2)
Alkaline Phosphatase: 50 U/L (ref 39–117)
BUN: 16 mg/dL (ref 6–23)
CO2: 28 mEq/L (ref 19–32)
Calcium: 9.2 mg/dL (ref 8.4–10.5)
Chloride: 103 mEq/L (ref 96–112)
Creatinine, Ser: 1 mg/dL (ref 0.40–1.50)
GFR: 85.14 mL/min (ref >60.00)
Glucose, Bld: 81 mg/dL (ref 70–99)
Potassium: 4 mEq/L (ref 3.5–5.1)
Sodium: 139 mEq/L (ref 135–145)
Total Bilirubin: 0.6 mg/dL (ref 0.2–1.2)
Total Protein: 6.6 g/dL (ref 6.0–8.3)

## 2019-05-24 LAB — LIPID PANEL
Cholesterol: 203 mg/dL — ABNORMAL HIGH (ref 0–200)
HDL: 45.5 mg/dL (ref >39.00)
LDL Cholesterol: 125 mg/dL — ABNORMAL HIGH (ref 0–99)
NonHDL: 157.14
Total CHOL/HDL Ratio: 4
Triglycerides: 163 mg/dL — ABNORMAL HIGH (ref 0.0–149.0)
VLDL: 32.6 mg/dL (ref 0.0–40.0)

## 2019-05-24 NOTE — Patient Instructions (Signed)
Give us 2-3 business days to get the results of your labs back.   Keep the diet clean and stay active.  Do monthly self testicular checks in the shower. You are feeling for lumps/bumps that don't belong. If you feel anything like this, let me know!  I recommend getting the flu shot in mid October. This suggestion would change if the CDC comes out with a different recommendation.   Let us know if you need anything. 

## 2019-05-24 NOTE — Progress Notes (Signed)
Chief Complaint  Patient presents with  . Annual Exam    Well Male Derrick Hobbs is here for a complete physical.   His last physical was >1 year ago.  Current diet: in general, a "healthy" diet.   Current exercise: some soccer, plays with children, active in yard Weight trend: has intentionally losing Daytime fatigue? No. Seat belt? Yes.     Health maintenance Tetanus- Yes HIV- No  Past Medical History:  Diagnosis Date  . Phimosis   . Wears contact lenses    left eye only     Past Surgical History:  Procedure Laterality Date  . CIRCUMCISION N/A 12/10/2015   Procedure: CIRCUMCISION ADULT;  Surgeon: Irine Seal, MD;  [facility];  Service: Urology;  Laterality: N/A;  . WISDOM TOOTH EXTRACTION  2013    Medications  Takes no meds routinely   Allergies No Known Allergies  Family History Family History  Problem Relation Age of Onset  . Hypertension Mother   . Asthma Daughter     Review of Systems: Constitutional: no fevers or chills Eye:  +vision problems- following with eye team Ear/Nose/Mouth/Throat:  Ears:  no tinnitus or hearing loss Nose/Mouth/Throat:  no complaints of nasal congestion, no sore throat Cardiovascular:  no chest pain, no palpitations Respiratory:  no cough and no shortness of breath Gastrointestinal:  no abdominal pain, no change in bowel habits GU:  Male: negative for dysuria, frequency, and incontinence and negative for prostate symptoms Musculoskeletal/Extremities:  no pain, redness, or swelling of the joints Integumentary (Skin/Breast):  no abnormal skin lesions reported Neurologic:  no headaches, no numbness, tingling Endocrine: No unexpected weight changes Hematologic/Lymphatic:  no night sweats  Exam BP 108/64 (BP Location: Left Arm, Patient Position: Sitting, Cuff Size: Normal)   Pulse (!) 57   Temp 98.7 F (37.1 C) (Oral)   Ht 5\' 9"  (1.753 m)   Wt 199 lb 8 oz (90.5 kg)   SpO2 98%   BMI 29.46  kg/m  General:  well developed, well nourished, in no apparent distress Skin:  no significant moles, warts, or growths Head:  no masses, lesions, or tenderness Eyes:  pupils equal and round, sclera anicteric without injection Ears:  canals without lesions, TMs shiny without retraction, no obvious effusion, no erythema Nose:  nares patent, septum midline, mucosa normal Throat/Pharynx:  lips and gingiva without lesion; tongue and uvula midline; non-inflamed pharynx; no exudates or postnasal drainage Neck: neck supple without adenopathy, thyromegaly, or masses Lungs:  clear to auscultation, breath sounds equal bilaterally, no respiratory distress Cardio:  regular rate and rhythm, no bruits, no LE edema Abdomen:  abdomen soft, nontender; bowel sounds normal; no masses or organomegaly Genital (male): circumcised penis, no lesions or discharge; testes present bilaterally without masses or tenderness Rectal: Deferred Musculoskeletal:  symmetrical muscle groups noted without atrophy or deformity Extremities:  no clubbing, cyanosis, or edema, no deformities, no skin discoloration Neuro:  gait normal; deep tendon reflexes normal and symmetric Psych: well oriented with normal range of affect and appropriate judgment/insight  Assessment and Plan  Well adult exam - Plan: CBC, Comprehensive metabolic panel, Lipid panel   Well 33 y.o. male. Counseled on diet and exercise. Self testicular exams rec'd.  Pt declined HIV screening.  Other orders as above. Follow up in 1 year pending the above workup. The patient voiced understanding and agreement to the plan.  [place], DO 05/24/19 12:58 PM

## 2019-11-09 ENCOUNTER — Encounter (HOSPITAL_BASED_OUTPATIENT_CLINIC_OR_DEPARTMENT_OTHER): Payer: Self-pay | Admitting: Emergency Medicine

## 2019-11-09 ENCOUNTER — Emergency Department (HOSPITAL_BASED_OUTPATIENT_CLINIC_OR_DEPARTMENT_OTHER)
Admission: EM | Admit: 2019-11-09 | Discharge: 2019-11-09 | Disposition: A | Discharge status: Home / Self Care | Payer: No Typology Code available for payment source | Attending: Emergency Medicine | Admitting: Emergency Medicine

## 2019-11-09 ENCOUNTER — Other Ambulatory Visit: Payer: Self-pay

## 2019-11-09 DIAGNOSIS — Y939 Activity, unspecified: Secondary | ICD-10-CM | POA: Insufficient documentation

## 2019-11-09 DIAGNOSIS — S0181XA Laceration without foreign body of other part of head, initial encounter: Secondary | ICD-10-CM | POA: Insufficient documentation

## 2019-11-09 DIAGNOSIS — Y999 Unspecified external cause status: Secondary | ICD-10-CM | POA: Insufficient documentation

## 2019-11-09 DIAGNOSIS — F17210 Nicotine dependence, cigarettes, uncomplicated: Secondary | ICD-10-CM | POA: Diagnosis not present

## 2019-11-09 DIAGNOSIS — Y929 Unspecified place or not applicable: Secondary | ICD-10-CM | POA: Diagnosis not present

## 2019-11-09 DIAGNOSIS — W010XXA Fall on same level from slipping, tripping and stumbling without subsequent striking against object, initial encounter: Secondary | ICD-10-CM

## 2019-11-09 DIAGNOSIS — W01198A Fall on same level from slipping, tripping and stumbling with subsequent striking against other object, initial encounter: Secondary | ICD-10-CM | POA: Insufficient documentation

## 2019-11-09 NOTE — ED Provider Notes (Signed)
[place] EMERGENCY DEPARTMENT Provider Note   CSN: [id number] Arrival date & time: 11/09/19  2259   History Chief Complaint  Patient presents with  . Fall    Derrick Hobbs is a 34 y.o. male.  The history is provided by the patient.  Fall  He states that he slipped on some wet wood and suffered a laceration to his left cheek when it hit a door jamb.  He denies other injury and denies loss of consciousness.  He is up-to-date on tetanus immunizations.  Past Medical History:  Diagnosis Date  . Phimosis   . Wears contact lenses    left eye only    Patient Active Problem List   Diagnosis Date Noted  . Erectile dysfunction 10/08/2017    Past Surgical History:  Procedure Laterality Date  . CIRCUMCISION N/A 12/10/2015   Procedure: CIRCUMCISION ADULT;  Surgeon: Irine Seal, MD;  [facility]  Service: Urology;  Laterality: N/A;  . WISDOM TOOTH EXTRACTION  2013       Family History  Problem Relation Age of Onset  . Hypertension Mother   . Asthma Daughter     Social History   Tobacco Use  . Smoking status: Current Every Day Smoker    Packs/day: 0.25    Years: 5.00    Pack years: 1.25    Types: Cigarettes  . Smokeless tobacco: Current User    Types: Snuff, Chew  . Tobacco comment: 2-3 cig daily  Substance Use Topics  . Alcohol use: Yes    Comment: occasional  . Drug use: No    Home Medications Prior to Admission medications   Not on File    Allergies    Patient has no known allergies.  Review of Systems   Review of Systems  All other systems reviewed and are negative.   Physical Exam Updated Vital Signs BP 138/86 (BP Location: Right Arm)   Pulse (!) 58   Temp 97.7 F (36.5 C) (Oral)   Resp 18   Ht 5\' 9"  (1.753 m)   Wt 90 kg   SpO2 99%   BMI 29.30 kg/m   Physical Exam Vitals and nursing note reviewed.   34 year old male, resting comfortably and in no acute distress. Vital signs are normal.  Oxygen saturation is 99%, which is normal. Head is normocephalic. PERRLA, EOMI. Oropharynx is clear. Neck is nontender and supple without adenopathy or JVD. Back is nontender and there is no CVA tenderness. Lungs are clear without rales, wheezes, or rhonchi. Chest is nontender. Heart has regular rate and rhythm without murmur. Abdomen is soft, flat, nontender without masses or hepatosplenomegaly and peristalsis is normoactive. Extremities have no cyanosis or edema, full range of motion is present. Skin is warm and dry without rash. Neurologic: Mental status is normal, cranial nerves are intact, there are no motor or sensory deficits.   ED Results / Procedures / Treatments    No results found.  Procedures Procedures  .Marland KitchenLaceration Repair  Date/Time: 11/09/2019 11:15 PM  Performed by: Delora Fuel, MD  Authorized by: Delora Fuel, MD  Consent:  Consent obtained: Verbal  Consent given by: Patient  Risks discussed: Infection and poor cosmetic result  Alternatives discussed: No treatment  Anesthesia (see MAR for exact dosages):  Anesthesia method: None  Laceration details:  Location: Face  Face location: L cheek  Length (cm): 2.5  Depth (mm): 1  Repair type:  Repair type: Simple  Pre-procedure details:  Preparation: Patient  was prepped and draped in usual sterile fashion  Exploration:  Hemostasis achieved with: Direct pressure Wound exploration: entire depth of wound probed and visualized  Wound extent: no foreign bodies/material noted  Contaminated: no  Treatment:  Area cleansed with: Shur-Clens  Amount of cleaning: Standard  Skin repair:  Repair method: Tissue adhesive  Approximation:  Approximation: Close  Post-procedure details:  Dressing: Open (no dressing)  Patient tolerance of procedure: Tolerated well, no immediate complications  Medications Ordered in ED Medications - No data to display  ED Course  I have reviewed the triage vital signs and the nursing  notes.  MDM Rules/Calculators/A&P Flap laceration of the left cheek closed with tissue adhesive.  Old records are reviewed, and he has no relevant past visits.  Final Clinical Impression(s) / ED Diagnoses Final diagnoses:  Fall from slipping, initial encounter  Laceration of face, initial encounter    Rx / DC Orders ED Discharge Orders    None       Dione Booze, MD 11/09/19 2329

## 2019-11-09 NOTE — ED Triage Notes (Signed)
Patient presents with complaints of fall this evening; laceration noted to left side of face; bleeding controlled; denies LOC.

## 2019-11-11 ENCOUNTER — Emergency Department (HOSPITAL_BASED_OUTPATIENT_CLINIC_OR_DEPARTMENT_OTHER)
Admission: EM | Admit: 2019-11-11 | Discharge: 2019-11-11 | Disposition: A | Discharge status: Home / Self Care | Payer: No Typology Code available for payment source | Attending: Emergency Medicine | Admitting: Emergency Medicine

## 2019-11-11 ENCOUNTER — Other Ambulatory Visit: Payer: Self-pay

## 2019-11-11 ENCOUNTER — Encounter (HOSPITAL_BASED_OUTPATIENT_CLINIC_OR_DEPARTMENT_OTHER): Payer: Self-pay

## 2019-11-11 DIAGNOSIS — S01412D Laceration without foreign body of left cheek and temporomandibular area, subsequent encounter: Secondary | ICD-10-CM | POA: Diagnosis not present

## 2019-11-11 DIAGNOSIS — Z5189 Encounter for other specified aftercare: Secondary | ICD-10-CM | POA: Insufficient documentation

## 2019-11-11 DIAGNOSIS — W19XXXD Unspecified fall, subsequent encounter: Secondary | ICD-10-CM | POA: Diagnosis not present

## 2019-11-11 DIAGNOSIS — F17210 Nicotine dependence, cigarettes, uncomplicated: Secondary | ICD-10-CM | POA: Diagnosis not present

## 2019-11-11 DIAGNOSIS — F17220 Nicotine dependence, chewing tobacco, uncomplicated: Secondary | ICD-10-CM | POA: Diagnosis not present

## 2019-11-11 NOTE — ED Provider Notes (Signed)
[place] EMERGENCY DEPARTMENT Provider Note   CSN: [id number] Arrival date & time: 11/11/19  1125     History Chief Complaint  Patient presents with  . Wound Check    Derrick Hobbs is a 34 y.o. male with a hx of tobacco abuse who presents to the ED for evaluation of wound recheck today. Patient seen in the ED 02/11 after L cheek facial injury sustained during a fall. Had skin flap type laceration which was repaired with dermabond. Patient states he had a bit of bleeding to the area last night and the area appeared purple prompting ED visit. No alleviating/aggravating factors. Bleeding quickly resolved. Denies fever, chills, erythema, warmth, purulent drainage, or new injury. UTD on tetanus.   HPI     Past Medical History:  Diagnosis Date  . Phimosis   . Wears contact lenses    left eye only    Patient Active Problem List   Diagnosis Date Noted  . Erectile dysfunction 10/08/2017    Past Surgical History:  Procedure Laterality Date  . CIRCUMCISION N/A 12/10/2015   Procedure: CIRCUMCISION ADULT;  Surgeon: Irine Seal, MD;  [facility]: [facility];  Service: Urology;  Laterality: N/A;  . WISDOM TOOTH EXTRACTION  2013       Family History  Problem Relation Age of Onset  . Hypertension Mother   . Asthma Daughter     Social History   Tobacco Use  . Smoking status: Current Every Day Smoker    Packs/day: 0.25    Years: 5.00    Pack years: 1.25    Types: Cigarettes  . Smokeless tobacco: Current User    Types: Snuff, Chew  . Tobacco comment: 2-3 cig daily  Substance Use Topics  . Alcohol use: Yes    Comment: occasional  . Drug use: No    Home Medications Prior to Admission medications   Not on File    Allergies    Patient has no known allergies.  Review of Systems   Review of Systems  Constitutional: Negative for chills and fever.  Respiratory: Negative for shortness of breath.   Cardiovascular: Negative for chest  pain.  Gastrointestinal: Negative for vomiting.  Skin: Positive for color change and wound.    Physical Exam Updated Vital Signs BP 115/81 (BP Location: Right Arm)   Pulse (!) 59   Temp 98 F (36.7 C) (Oral)   Resp 18   Ht 5\' 9"  (1.753 m)   Wt 90 kg   SpO2 100%   BMI 29.30 kg/m   Physical Exam Vitals and nursing note reviewed.  Constitutional:      General: He is not in acute distress.    Appearance: He is well-developed.  HENT:     Head: Normocephalic.     Comments: L cheek: there is a V shaped wound noted with overlying dermabond, lateral edge of the dermabond is starting to peel up some. Small amount of blood noted below the dermabond. No active bleeding. No erythema, warmth, purulent drainage, induration, or fluctuance. No significant tenderness to palpation.  Eyes:     General:        Right eye: No discharge.        Left eye: No discharge.     Conjunctiva/sclera: Conjunctivae normal.  Neurological:     Mental Status: He is alert.     Comments: Clear speech.   Psychiatric:        Behavior: Behavior normal.  Thought Content: Thought content normal.       ED Results / Procedures / Treatments   Labs (all labs ordered are listed, but only abnormal results are displayed) Labs Reviewed - No data to display  EKG None  Radiology No results found.  Procedures Procedures (including critical care time)  Medications Ordered in ED Medications - No data to display  ED Course  I have reviewed the triage vital signs and the nursing notes.  Pertinent labs & imaging results that were available during my care of the patient were reviewed by me and considered in my medical decision making (see chart for details).    MDM Rules/Calculators/A&P                      Patient presents to the ED for wound recheck s/p dermabond closure 11/09/19.  Small amount of blood noted inferior to dermabond, no active bleeding. Exam not consistent w/ infection. Wound appears to  be healing appropriately. Applied bandage to area to avoid irritation, provided wound care instructions, appears appropriate for discharge. I discussed treatment plan and return precautions with the patient. Provided opportunity for questions, patient confirmed understanding and is in agreement with plan.    Final Clinical Impression(s) / ED Diagnoses Final diagnoses:  Visit for wound check    Rx / DC Orders ED Discharge Orders    None       Desmond Lope 11/11/19 1152    Raeford Razor, MD 11/11/19 1423

## 2019-11-11 NOTE — Discharge Instructions (Addendum)
You were seen in the ER today for a wound check Your wound appears to be healing appropriately.  Please follow attached instructions.  Return to the ED for new or worsening symptoms including but not limited to uncontrollable bleeding, redness around the cut, drainage from the cut, fever, chills, or any other concerns.

## 2019-11-11 NOTE — ED Triage Notes (Signed)
Was seen for laceration left side of face 2 days ago, asked to return if worsened. Pt reports swelling and drainage to area  Denies fevers.  Not taking any medications

## 2020-05-24 ENCOUNTER — Encounter: Payer: No Typology Code available for payment source | Admitting: Family Medicine

## 2022-05-18 ENCOUNTER — Encounter: Payer: Self-pay | Admitting: Family Medicine

## 2022-05-18 ENCOUNTER — Ambulatory Visit (INDEPENDENT_AMBULATORY_CARE_PROVIDER_SITE_OTHER): Payer: No Typology Code available for payment source | Admitting: Family Medicine

## 2022-05-18 VITALS — BP 104/68 | HR 67 | Temp 98.0°F | Ht 70.0 in | Wt 230.5 lb

## 2022-05-18 DIAGNOSIS — Z0000 Encounter for general adult medical examination without abnormal findings: Secondary | ICD-10-CM | POA: Diagnosis not present

## 2022-05-18 DIAGNOSIS — H18602 Keratoconus, unspecified, left eye: Secondary | ICD-10-CM

## 2022-05-18 NOTE — Progress Notes (Signed)
Chief Complaint  Patient presents with   Annual Exam    Well Male Derrick Hobbs is here for a complete physical.   His last physical was >1 year ago.  Current diet: in general, a "healthy" diet.   Current exercise: soccer, skating, cycling Weight trend: increasing Fatigue out of ordinary? No. Seat belt? Yes.   Advanced directive? No  Health maintenance Tetanus- Yes HIV- Yes Hep C- Yes  Past Medical History:  Diagnosis Date   Phimosis    Wears contact lenses    left eye only     Past Surgical History:  Procedure Laterality Date   CIRCUMCISION N/A 12/10/2015   Procedure: CIRCUMCISION ADULT;  Surgeon: Bjorn Pippin, MD;  Location: [facility];  Service: Urology;  Laterality: N/A;   WISDOM TOOTH EXTRACTION  2013    Medications  Takes no meds routinely.    Allergies No Known Allergies  Family History Family History  Problem Relation Age of Onset   Hypertension Mother    Asthma Daughter     Review of Systems: Constitutional: no fevers or chills Eye:  no recent significant change in vision Ear/Nose/Mouth/Throat:  Ears:  no hearing loss Nose/Mouth/Throat:  no complaints of nasal congestion, no sore throat Cardiovascular:  no chest pain Respiratory:  no shortness of breath Gastrointestinal:  no abdominal pain, no change in bowel habits GU:  Male: negative for dysuria Musculoskeletal/Extremities:  no pain of the joints Integumentary (Skin/Breast):  no abnormal skin lesions reported Neurologic:  no headaches Endocrine: No unexpected weight changes Hematologic/Lymphatic:  no night sweats  Exam BP 104/68   Pulse 67   Temp 98 F (36.7 C) (Oral)   Ht 5\' 10"  (1.778 m)   Wt 230 lb 8 oz (104.6 kg)   SpO2 95%   BMI 33.07 kg/m  General:  well developed, well nourished, in no apparent distress Skin:  no significant moles, warts, or growths Head:  no masses, lesions, or tenderness Eyes:  pupils equal and round, sclera anicteric without  injection Ears:  canals without lesions, TMs shiny without retraction, no obvious effusion, no erythema Nose:  nares patent, septum midline, mucosa normal Throat/Pharynx:  lips and gingiva without lesion; tongue and uvula midline; non-inflamed pharynx; no exudates or postnasal drainage Neck: neck supple without adenopathy, thyromegaly, or masses Lungs:  clear to auscultation, breath sounds equal bilaterally, no respiratory distress Cardio:  regular rate and rhythm, no bruits, no LE edema Abdomen:  abdomen soft, nontender; bowel sounds normal; no masses or organomegaly Genital (male): Deferred Rectal: Deferred Musculoskeletal:  symmetrical muscle groups noted without atrophy or deformity Extremities:  no clubbing, cyanosis, or edema, no deformities, no skin discoloration Neuro:  gait normal; deep tendon reflexes normal and symmetric Psych: well oriented with normal range of affect and appropriate judgment/insight  Assessment and Plan  Well adult exam  Keratoconus of left eye - Plan: Ambulatory referral to Ophthalmology   Well 36 y.o. male. Counseled on diet and exercise. Self testicular exams recommended at least monthly.  Advanced directive form provided today.  Other orders as above. Follow up in 1 year pending the above workup. The patient voiced understanding and agreement to the plan.  Derrick [place], DO 05/18/22 1:32 PM

## 2022-05-18 NOTE — Patient Instructions (Addendum)
Give us 2-3 business days to get the results of your labs back.   Keep the diet clean and stay active.  Do monthly self testicular checks in the shower. You are feeling for lumps/bumps that don't belong. If you feel anything like this, let me know!  I recommend getting the flu shot in mid October. This suggestion would change if the CDC comes out with a different recommendation.   Please get me a copy of your advanced directive form at your convenience.   Let us know if you need anything.  

## 2023-05-26 ENCOUNTER — Other Ambulatory Visit (HOSPITAL_BASED_OUTPATIENT_CLINIC_OR_DEPARTMENT_OTHER): Payer: Self-pay

## 2023-05-26 ENCOUNTER — Ambulatory Visit (INDEPENDENT_AMBULATORY_CARE_PROVIDER_SITE_OTHER): Payer: 59 | Admitting: Family Medicine

## 2023-05-26 ENCOUNTER — Encounter: Payer: Self-pay | Admitting: Family Medicine

## 2023-05-26 VITALS — BP 108/68 | HR 46 | Temp 98.2°F | Ht 70.0 in | Wt 201.5 lb

## 2023-05-26 DIAGNOSIS — L255 Unspecified contact dermatitis due to plants, except food: Secondary | ICD-10-CM | POA: Diagnosis not present

## 2023-05-26 DIAGNOSIS — T7840XA Allergy, unspecified, initial encounter: Secondary | ICD-10-CM

## 2023-05-26 DIAGNOSIS — Z0000 Encounter for general adult medical examination without abnormal findings: Secondary | ICD-10-CM | POA: Diagnosis not present

## 2023-05-26 DIAGNOSIS — M79675 Pain in left toe(s): Secondary | ICD-10-CM

## 2023-05-26 MED ORDER — EPINEPHRINE 0.3 MG/0.3ML IJ SOAJ
0.3000 mg | INTRAMUSCULAR | 2 refills | Status: AC | PRN
  Filled 2023-05-26: qty 2, 2d supply, fill #0

## 2023-05-26 MED ORDER — TRIAMCINOLONE ACETONIDE 0.1 % EX CREA
1.0000 | TOPICAL_CREAM | Freq: Two times a day (BID) | CUTANEOUS | 0 refills | Status: AC
  Filled 2023-05-26: qty 30, 15d supply, fill #0

## 2023-05-26 NOTE — Patient Instructions (Addendum)
Give [place] 2-3 business days to get the results of your labs back.   Keep the diet clean and stay active.  Do monthly self testicular checks in the shower. You are feeling for lumps/bumps that don't belong. If you feel anything like this, let me know!  Please get me a copy of your advanced directive form at your convenience.   I recommend getting the flu shot in mid October. This suggestion would change if the CDC comes out with a different recommendation.   Ice/cold pack over area for 10-15 min twice daily.  OK to take Tylenol 1000 mg (2 extra strength tabs) or 975 mg (3 regular strength tabs) every 6 hours as needed.  We will be in touch regarding you Xray results.   Please get your X-ray done in the basement of our [place] office located on: [address] [place], [place] [address]  You do not need an appointment for that location.   Let us know if you need anything.

## 2023-05-26 NOTE — Progress Notes (Signed)
Chief Complaint  Patient presents with   Annual Exam    Poison ivy    Well Male Derrick Hobbs is here for a complete physical.   His last physical was >1 year ago.  Current diet: in general, a "healthy" diet.   Current exercise: soccer Weight trend: losing intentionally  Fatigue out of ordinary? No. Seat belt? Yes.   Advanced directive? No  Health maintenance Tetanus- Yes HIV- Yes Hep C- Yes  Poison ivy Got into poison ivy last week. On arms. Has had before, feels it is steadily improving. Itches. No pain or drainage. Has used Clorox and OTC itching meds. No sick contacts.   Toe pain Around 6 months ago, the patient was playing soccer and someone stepped on his left foot.  He has been having left toe pain since then.  It hurts when he stretches it out.  Walking is okay overall.  It does affect his ability to kick the ball with his left foot and put lots of pressure on it.  He has not tried anything at home so far.  No neurologic signs or symptoms.  Past Medical History:  Diagnosis Date   Phimosis    Wears contact lenses    left eye only     Past Surgical History:  Procedure Laterality Date   CIRCUMCISION N/A 12/10/2015   Procedure: CIRCUMCISION ADULT;  Surgeon: Bjorn Pippin, MD;  Location: [facility];  Service: Urology;  Laterality: N/A;   WISDOM TOOTH EXTRACTION  2013    Medications  Takes no medications routinely.    Allergies No Known Allergies  Family History Family History  Problem Relation Age of Onset   Hypertension Mother    Asthma Daughter     Review of Systems: Constitutional: no fevers or chills Eye:  no recent significant change in vision Ear/Nose/Mouth/Throat:  Ears:  no hearing loss Nose/Mouth/Throat:  no complaints of nasal congestion, no sore throat Cardiovascular:  no chest pain Respiratory:  no shortness of breath Gastrointestinal:  no abdominal pain, no change in bowel habits GU:  Male: negative for  dysuria Musculoskeletal/Extremities: + Toe pain Integumentary (Skin/Breast): As noted in HPI Neurologic:  no headaches Endocrine: No unexpected weight changes Hematologic/Lymphatic:  no night sweats  Exam BP 108/68 (BP Location: Left Arm, Patient Position: Sitting, Cuff Size: Large)   Pulse (!) 46   Temp 98.2 F (36.8 C) (Oral)   Ht 5\' 10"  (1.778 m)   Wt 201 lb 8 oz (91.4 kg)   SpO2 98%   BMI 28.91 kg/m  General:  well developed, well nourished, in no apparent distress Skin: Linear streaks of papules and vesicles over both proximal forearms; otherwise, no significant moles, warts, or growths Head:  no masses, lesions, or tenderness Eyes:  pupils equal and round, sclera anicteric without injection Ears:  canals without lesions, TMs shiny without retraction, no obvious effusion, no erythema Nose:  nares patent, mucosa normal Throat/Pharynx:  lips and gingiva without lesion; tongue and uvula midline; non-inflamed pharynx; no exudates or postnasal drainage Neck: neck supple without adenopathy, thyromegaly, or masses Lungs:  clear to auscultation, breath sounds equal bilaterally, no respiratory distress Cardio:  regular rhythm, bradycardic, no bruits, no LE edema Abdomen:  abdomen soft, nontender; bowel sounds normal; no masses or organomegaly Genital (male): Deferred Rectal: Deferred Musculoskeletal: TTP over the proximal phalanx of the second left toe; there is no deformity, ecchymosis, erythema, edema, crepitus, or poor range of motion; symmetrical muscle groups noted without atrophy or deformity  Extremities:  no clubbing, cyanosis, or edema, no deformities, no skin discoloration Neuro:  gait normal; deep tendon reflexes normal and symmetric Psych: well oriented with normal range of affect and appropriate judgment/insight  Assessment and Plan  Well adult exam - Plan: CBC, Comprehensive metabolic panel, Lipid panel  Rhus dermatitis - Plan: triamcinolone cream (KENALOG) 0.1  %  Allergic reaction, initial encounter  Pain of toe of left foot - Plan: DG Toe 2nd Left   Well 37 y.o. male. Counseled on diet and exercise. Self testicular exams recommended at least monthly.  Advanced directive form provided today.  Poison ivy: Feels things are getting better, more of a mild case for him.  Will send a topical steroid to help with symptoms moving forward.  Consider an oral antihistamine. Toe pain: Heat, ice, Tylenol, check x-ray.  Follow-up pending the results. Other orders as above.  He will come back for fasting labs. Follow up in 1 year pending the above workup. The patient voiced understanding and agreement to the plan.  Jilda Roche Harlan, DO 05/26/23 3:16 PM

## 2023-05-28 ENCOUNTER — Other Ambulatory Visit (HOSPITAL_BASED_OUTPATIENT_CLINIC_OR_DEPARTMENT_OTHER): Payer: Self-pay

## 2023-06-04 ENCOUNTER — Other Ambulatory Visit: Payer: 59

## 2023-06-11 ENCOUNTER — Other Ambulatory Visit: Payer: Self-pay | Admitting: Family Medicine

## 2023-06-11 ENCOUNTER — Other Ambulatory Visit (INDEPENDENT_AMBULATORY_CARE_PROVIDER_SITE_OTHER): Payer: 59

## 2023-06-11 DIAGNOSIS — Z0000 Encounter for general adult medical examination without abnormal findings: Secondary | ICD-10-CM | POA: Diagnosis not present

## 2023-06-11 DIAGNOSIS — E785 Hyperlipidemia, unspecified: Secondary | ICD-10-CM

## 2023-06-11 LAB — CBC
HCT: 41.8 % (ref 39.0–52.0)
Hemoglobin: 13.9 g/dL (ref 13.0–17.0)
MCHC: 33.4 g/dL (ref 30.0–36.0)
MCV: 90.7 fl (ref 78.0–100.0)
Platelets: 226 10*3/uL (ref 150.0–400.0)
RBC: 4.61 Mil/uL (ref 4.22–5.81)
RDW: 13 % (ref 11.5–15.5)
WBC: 6.3 10*3/uL (ref 4.0–10.5)

## 2023-06-11 LAB — COMPREHENSIVE METABOLIC PANEL
ALT: 21 U/L (ref 0–53)
AST: 16 U/L (ref 0–37)
Albumin: 3.8 g/dL (ref 3.5–5.2)
Alkaline Phosphatase: 56 U/L (ref 39–117)
BUN: 10 mg/dL (ref 6–23)
CO2: 27 meq/L (ref 19–32)
Calcium: 9 mg/dL (ref 8.4–10.5)
Chloride: 105 meq/L (ref 96–112)
Creatinine, Ser: 0.93 mg/dL (ref 0.40–1.50)
GFR: 103.92 mL/min (ref >60.00)
Glucose, Bld: 83 mg/dL (ref 70–99)
Potassium: 4.1 meq/L (ref 3.5–5.1)
Sodium: 140 meq/L (ref 135–145)
Total Bilirubin: 0.3 mg/dL (ref 0.2–1.2)
Total Protein: 6.2 g/dL (ref 6.0–8.3)

## 2023-06-11 LAB — LIPID PANEL
Cholesterol: 172 mg/dL (ref 0–200)
HDL: 47.3 mg/dL (ref >39.00)
LDL Cholesterol: 65 mg/dL (ref 0–99)
NonHDL: 125.16
Total CHOL/HDL Ratio: 4
Triglycerides: 299 mg/dL — ABNORMAL HIGH (ref 0.0–149.0)
VLDL: 59.8 mg/dL — ABNORMAL HIGH (ref 0.0–40.0)

## 2023-07-23 ENCOUNTER — Other Ambulatory Visit: Payer: 59

## 2023-07-30 ENCOUNTER — Other Ambulatory Visit: Payer: 59

## 2023-08-13 ENCOUNTER — Other Ambulatory Visit: Payer: 59

## 2023-08-20 ENCOUNTER — Other Ambulatory Visit: Payer: 59

## 2023-08-24 ENCOUNTER — Other Ambulatory Visit (INDEPENDENT_AMBULATORY_CARE_PROVIDER_SITE_OTHER): Payer: 59

## 2023-08-24 DIAGNOSIS — E785 Hyperlipidemia, unspecified: Secondary | ICD-10-CM

## 2023-08-24 LAB — LIPID PANEL
Cholesterol: 201 mg/dL — ABNORMAL HIGH (ref 0–200)
HDL: 44.4 mg/dL (ref >39.00)
LDL Cholesterol: 127 mg/dL — ABNORMAL HIGH (ref 0–99)
NonHDL: 156.87
Total CHOL/HDL Ratio: 5
Triglycerides: 150 mg/dL — ABNORMAL HIGH (ref 0.0–149.0)
VLDL: 30 mg/dL (ref 0.0–40.0)
